# Patient Record
Sex: Male | Born: 1960 | Race: White | Hispanic: No | Marital: Married | Smoking: Never smoker
Health system: Southern US, Community
[De-identification: ages and names within clinical notes are randomized; demographics above are authoritative.]

## PROBLEM LIST (undated history)

## (undated) DIAGNOSIS — E785 Hyperlipidemia, unspecified: Secondary | ICD-10-CM

## (undated) DIAGNOSIS — Z789 Other specified health status: Secondary | ICD-10-CM

## (undated) HISTORY — PX: GUM SURGERY: SHX658

## (undated) HISTORY — PX: POLYPECTOMY: SHX149

## (undated) HISTORY — PX: COLONOSCOPY: SHX174

## (undated) HISTORY — DX: Hyperlipidemia, unspecified: E78.5

---

## 2002-09-28 ENCOUNTER — Ambulatory Visit (HOSPITAL_COMMUNITY): Admission: RE | Admit: 2002-09-28 | Discharge: 2002-09-28 | Payer: Self-pay | Admitting: General Surgery

## 2005-03-25 ENCOUNTER — Emergency Department (HOSPITAL_COMMUNITY): Admission: EM | Admit: 2005-03-25 | Discharge: 2005-03-25 | Payer: Self-pay | Admitting: Family Medicine

## 2011-07-12 ENCOUNTER — Emergency Department (HOSPITAL_COMMUNITY)
Admission: EM | Admit: 2011-07-12 | Discharge: 2011-07-12 | Disposition: A | Discharge status: Home / Self Care | Payer: BC Managed Care – PPO | Attending: Emergency Medicine | Admitting: Emergency Medicine

## 2011-07-12 DIAGNOSIS — S61209A Unspecified open wound of unspecified finger without damage to nail, initial encounter: Secondary | ICD-10-CM | POA: Insufficient documentation

## 2011-07-12 DIAGNOSIS — S61011A Laceration without foreign body of right thumb without damage to nail, initial encounter: Secondary | ICD-10-CM

## 2011-07-12 DIAGNOSIS — W260XXA Contact with knife, initial encounter: Secondary | ICD-10-CM | POA: Insufficient documentation

## 2011-07-12 DIAGNOSIS — W261XXA Contact with sword or dagger, initial encounter: Secondary | ICD-10-CM | POA: Insufficient documentation

## 2011-07-12 DIAGNOSIS — M79609 Pain in unspecified limb: Secondary | ICD-10-CM | POA: Insufficient documentation

## 2011-07-12 MED ORDER — CEPHALEXIN 500 MG PO CAPS: 500.0000 mg | ORAL_CAPSULE | Freq: Four times a day (QID) | ORAL | Status: AC

## 2011-07-12 NOTE — ED Provider Notes (Signed)
Medical screening examination/treatment/procedure(s) were performed by non-physician practitioner and as supervising physician I was immediately available for consultation/collaboration.  Nicholes Stairs, MD 07/12/11 2340

## 2011-07-12 NOTE — ED Notes (Signed)
Pt refused to wait for tetnus shot states will go and see family doctor tomorrow and get one.

## 2011-07-12 NOTE — ED Notes (Signed)
Pt to ED with c/o laceration to right thumb.  Pt states was cutting meat when he sliced his thumb.  Wound with dressnig intact; bleeding controlled.

## 2011-07-12 NOTE — ED Provider Notes (Signed)
History     CSN: [id number] Arrival date & time: 07/12/2011  9:24 PM   First MD Initiated Contact with Patient 07/12/11 2140      Chief Complaint  Patient presents with  . Extremity Laceration    laceration thumb right hand with utility knife    (Consider location/radiation/quality/duration/timing/severity/associated sxs/prior treatment) Patient is a 50 y.o. male presenting with skin laceration. The history is provided by the patient. No language interpreter was used.  Laceration  The incident occurred 1 to 2 hours ago. The laceration is located on the right hand. The laceration is 3 cm in size. The laceration mechanism was a a dirty knife. The pain is at a severity of 3/10. The pain is mild. The pain has been constant since onset. He reports no foreign bodies present. His tetanus status is UTD.    No past medical history on file.  No past surgical history on file.  No family history on file.  History  Substance Use Topics  . Smoking status: Not on file  . Smokeless tobacco: Not on file  . Alcohol Use: Not on file      Review of Systems  Constitutional: Negative.   HENT: Negative.   Eyes: Negative.   Respiratory: Negative.   Cardiovascular: Negative.   Gastrointestinal: Negative.   Genitourinary: Negative.   Musculoskeletal: Negative.   Skin: Positive for wound.  Neurological: Negative.   Hematological: Negative.   Psychiatric/Behavioral: Negative.     Allergies  Review of patient's allergies indicates no known allergies.  Home Medications   Current Outpatient Rx  Name Route Sig Dispense Refill  . ATORVASTATIN CALCIUM 10 MG PO TABS Oral Take 10 mg by mouth daily.        BP 131/83  Pulse 78  Temp(Src) 97.8 F (36.6 C) (Oral)  Resp 16  SpO2 98%  Physical Exam  Nursing note and vitals reviewed. Constitutional: He is oriented to person, place, and time. He appears well-developed and well-nourished.  HENT:  Head: Normocephalic and atraumatic.    Mouth/Throat: Oropharynx is clear and moist.  Eyes: Conjunctivae are normal. Pupils are equal, round, and reactive to light.  Neck: Normal range of motion. Neck supple.  Cardiovascular: Normal rate, regular rhythm and normal heart sounds.   Pulmonary/Chest: Effort normal.  Abdominal: Soft. There is no tenderness.  Musculoskeletal: Normal range of motion.  Neurological: He is alert and oriented to person, place, and time.  Skin: Skin is warm and dry. Laceration noted.     Psychiatric: He has a normal mood and affect. His behavior is normal. Judgment and thought content normal.    ED Course  LACERATION REPAIR Date/Time: 07/12/2011 10:34 PM Performed by: Marisue Humble, Myrian Botello C. Authorized by: Patrecia Pour Consent: Verbal consent obtained. Written consent not obtained. Risks and benefits: risks, benefits and alternatives were discussed Consent given by: patient Patient understanding: patient states understanding of the procedure being performed Patient consent: the patient's understanding of the procedure matches consent given Procedure consent: procedure consent matches procedure scheduled Relevant documents: relevant documents not present or verified Test results: test results not available Site marked: the operative site was not marked Imaging studies: imaging studies not available Patient identity confirmed: verbally with patient and arm band Time out: Immediately prior to procedure a "time out" was called to verify the correct patient, procedure, equipment, support staff and site/side marked as required. Body area: upper extremity Location details: right thumb Laceration length: 3 cm Foreign bodies: no foreign bodies Tendon involvement: none Nerve  involvement: none Vascular damage: no Anesthesia: digital block Local anesthetic: lidocaine 1% without epinephrine Anesthetic total: 7 ml Patient sedated: no Preparation: Patient was prepped and draped in the usual sterile  fashion. Irrigation solution: saline Irrigation method: syringe Amount of cleaning: extensive Debridement: minimal Degree of undermining: none Skin closure: 5-0 nylon Number of sutures: 6 Technique: simple Approximation: close Approximation difficulty: simple Dressing: antibiotic ointment, 4x4 sterile gauze and tube gauze Patient tolerance: Patient tolerated the procedure well with no immediate complications.   (including critical care time)  Labs Reviewed - No data to display No results found.   Right thumb laceration   MDM  Right thumb laceration - avulsed - repaired without difficulty - reports dirty knife, will place on abx        Zakariah Dejarnette C. [place], [place] 07/12/11 2237

## 2012-05-08 ENCOUNTER — Ambulatory Visit (INDEPENDENT_AMBULATORY_CARE_PROVIDER_SITE_OTHER): Payer: BC Managed Care – PPO | Admitting: Surgery

## 2012-05-08 ENCOUNTER — Encounter (INDEPENDENT_AMBULATORY_CARE_PROVIDER_SITE_OTHER): Payer: Self-pay | Admitting: Surgery

## 2012-05-08 VITALS — BP 114/68 | HR 64 | Temp 97.8°F | Resp 16 | Ht 74.0 in | Wt 193.0 lb

## 2012-05-08 DIAGNOSIS — E7800 Pure hypercholesterolemia, unspecified: Secondary | ICD-10-CM

## 2012-05-08 DIAGNOSIS — K4090 Unilateral inguinal hernia, without obstruction or gangrene, not specified as recurrent: Secondary | ICD-10-CM

## 2012-05-08 NOTE — Progress Notes (Signed)
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  He has noticed discomfort and a bulge in his left groin with his work and also had leisure. After a brief vacation he was less symptomatic but then he had a cough and with coughing he was having some left inguinal pain.  I described left inguinal herniorrhaphy eager laparoscopic or open done and discussed its complications including numbness, prolonged nerve pain, or recurrence. He is a fairly thin man who does have an active job and I think he would be best served with an open hernia repair with mesh. I described to him as well. He likely going to schedule this as possible and we will schedule a [facility] surgery.  No past medical history on file.  No past surgical history on file.  Current Outpatient Prescriptions  Medication Sig Dispense Refill  . atorvastatin (LIPITOR) 10 MG tablet Take 10 mg by mouth daily.         Review of patient's allergies indicates no known allergies. No family history on file. Social History:   does not have a smoking history on file. He does not have any smokeless tobacco history on file. His alcohol and drug histories not on file.   REVIEW OF SYSTEMS - PERTINENT POSITIVES ONLY: Negative for DVT or chronic medical problems  Physical Exam:   There were no vitals taken for this visit. There is no height or weight on file to calculate BMI.  Gen:  WDWN WM NAD  Neurological: Alert and oriented to person, place, and time. Motor and sensory function is grossly intact  Head: Normocephalic and atraumatic.  Eyes: Conjunctivae are normal. Pupils are equal, round, and reactive to light. No scleral icterus.  Neck: Normal range of motion. Neck supple. No tracheal deviation or thyromegaly present.  Cardiovascular:  SR without murmurs or gallops.  No carotid bruits Respiratory: Effort normal.  No respiratory distress.  No chest wall tenderness. Breath sounds normal.  No wheezes, rales or rhonchi.  Abdomen:  nontender  GU: testes without masses.  No right hernia.  There is a prominent left inguinal hernia that is reducible. Musculoskeletal: Normal range of motion. Extremities are nontender. No cyanosis, edema or clubbing noted Lymphadenopathy: No cervical, preauricular, postauricular or axillary adenopathy is present Skin: Skin is warm and dry. No rash noted. No diaphoresis. No erythema. No pallor. Pscyh: Normal mood and affect. Behavior is normal. Judgment and thought content normal.   LABORATORY RESULTS: No results found for this or any previous visit (from the past 48 hour(s)).  RADIOLOGY RESULTS: No results found.  Problem List: There is no problem list on file for this patient.   Assessment & Plan: Left inguinal hernia that is symptomatic.  Plan LIH with mesh at CDS under general    Matt B. Daphine Deutscher, MD, [facility] [phone] beeper [phone]  05/08/2012 10:24 AM

## 2012-05-08 NOTE — Patient Instructions (Signed)
Inguinal Hernia, Adult  Muscles help keep everything in the body in its proper place. But if a weak spot in the muscles develops, something can poke through. That is called a hernia. When this happens in the lower part of the belly (abdomen), it is called an inguinal hernia. (It takes its name from a part of the body in this region called the inguinal canal.) A weak spot in the wall of muscles lets some fat or part of the small intestine bulge through. An inguinal hernia can develop at any age. Men get them more often than women.  CAUSES   In adults, an inguinal hernia develops over time.  · It can be triggered by:  · Suddenly straining the muscles of the lower abdomen.  · Lifting heavy objects.  · Straining to have a bowel movement. Difficult bowel movements (constipation) can lead to this.  · Constant coughing. This may be caused by smoking or lung disease.  · Being overweight.  · Being pregnant.  · Working at a job that requires long periods of standing or heavy lifting.  · Having had an inguinal hernia before.  One type can be an emergency situation. It is called a strangulated inguinal hernia. It develops if part of the small intestine slips through the weak spot and cannot get back into the abdomen. The blood supply can be cut off. If that happens, part of the intestine may die. This situation requires emergency surgery.  SYMPTOMS   Often, a small inguinal hernia has no symptoms. It is found when a healthcare provider does a physical exam. Larger hernias usually have symptoms.   · In adults, symptoms may include:  · A lump in the groin. This is easier to see when the person is standing. It might disappear when lying down.  · In men, a lump in the scrotum.  · Pain or burning in the groin. This occurs especially when lifting, straining or coughing.  · A dull ache or feeling of pressure in the groin.  · Signs of a strangulated hernia can include:  · A bulge in the groin that becomes very painful and tender to the  touch.  · A bulge that turns red or purple.  · Fever, nausea and vomiting.  · Inability to have a bowel movement or to pass gas.  DIAGNOSIS   To decide if you have an inguinal hernia, a healthcare provider will probably do a physical examination.  · This will include asking questions about any symptoms you have noticed.  · The healthcare provider might feel the groin area and ask you to cough. If an inguinal hernia is felt, the healthcare provider may try to slide it back into the abdomen.  · Usually no other tests are needed.  TREATMENT   Treatments can vary. The size of the hernia makes a difference. Options include:  · Watchful waiting. This is often suggested if the hernia is small and you have had no symptoms.  · No medical procedure will be done unless symptoms develop.  · You will need to watch closely for symptoms. If any occur, contact your healthcare provider right away.  · Surgery. This is used if the hernia is larger or you have symptoms.  · Open surgery. This is usually an outpatient procedure (you will not stay overnight in a hospital). An cut (incision) is made through the skin in the groin. The hernia is put back inside the abdomen. The weak area in the muscles is   then repaired by herniorrhaphy or hernioplasty. Herniorrhaphy: in this type of surgery, the weak muscles are sewn back together. Hernioplasty: a patch or mesh is used to close the weak area in the abdominal wall.  · Laparoscopy. In this procedure, a surgeon makes small incisions. A thin tube with a tiny video camera (called a laparoscope) is put into the abdomen. The surgeon repairs the hernia with mesh by looking with the video camera and using two long instruments.  HOME CARE INSTRUCTIONS   · After surgery to repair an inguinal hernia:  · You will need to take pain medicine prescribed by your healthcare provider. Follow all directions carefully.  · You will need to take care of the wound from the incision.  · Your activity will be  restricted for awhile. This will probably include no heavy lifting for several weeks. You also should not do anything too active for a few weeks. When you can return to work will depend on the type of job that you have.  · During "watchful waiting" periods, you should:  · Maintain a healthy weight.  · Eat a diet high in fiber (fruits, vegetables and whole grains).  · Drink plenty of fluids to avoid constipation. This means drinking enough water and other liquids to keep your urine clear or pale yellow.  · Do not lift heavy objects.  · Do not stand for long periods of time.  · Quit smoking. This should keep you from developing a frequent cough.  SEEK MEDICAL CARE IF:   · A bulge develops in your groin area.  · You feel pain, a burning sensation or pressure in the groin. This might be worse if you are lifting or straining.  · You develop a fever of more than 100.5° F (38.1° C).  SEEK IMMEDIATE MEDICAL CARE IF:   · Pain in the groin increases suddenly.  · A bulge in the groin gets bigger suddenly and does not go down.  · For men, there is sudden pain in the scrotum. Or, the size of the scrotum increases.  · A bulge in the groin area becomes red or purple and is painful to touch.  · You have nausea or vomiting that does not go away.  · You feel your heart beating much faster than normal.  · You cannot have a bowel movement or pass gas.  · You develop a fever of more than 102.0° F (38.9° C).  Document Released: 01/02/2009 Document Revised: 08/05/2011 Document Reviewed: 01/02/2009  ExitCare® Patient Information ©2012 [employer].

## 2012-05-11 ENCOUNTER — Encounter (HOSPITAL_BASED_OUTPATIENT_CLINIC_OR_DEPARTMENT_OTHER): Payer: Self-pay | Admitting: *Deleted

## 2012-05-11 NOTE — Progress Notes (Signed)
No labs needed-healthy-active person-originally from [place]

## 2012-05-16 ENCOUNTER — Encounter (HOSPITAL_BASED_OUTPATIENT_CLINIC_OR_DEPARTMENT_OTHER): Payer: Self-pay | Admitting: Anesthesiology

## 2012-05-16 ENCOUNTER — Encounter (HOSPITAL_BASED_OUTPATIENT_CLINIC_OR_DEPARTMENT_OTHER)
Admission: RE | Disposition: A | Discharge status: Home / Self Care | Payer: Self-pay | Source: Ambulatory Visit | Attending: Surgery

## 2012-05-16 ENCOUNTER — Ambulatory Visit (HOSPITAL_BASED_OUTPATIENT_CLINIC_OR_DEPARTMENT_OTHER)
Admission: RE | Admit: 2012-05-16 | Discharge: 2012-05-16 | Disposition: A | Discharge status: Home / Self Care | Payer: BC Managed Care – PPO | Source: Ambulatory Visit | Attending: Surgery | Admitting: Surgery

## 2012-05-16 ENCOUNTER — Encounter (HOSPITAL_BASED_OUTPATIENT_CLINIC_OR_DEPARTMENT_OTHER): Payer: Self-pay | Admitting: Certified Registered Nurse Anesthetist

## 2012-05-16 ENCOUNTER — Ambulatory Visit (HOSPITAL_BASED_OUTPATIENT_CLINIC_OR_DEPARTMENT_OTHER): Payer: BC Managed Care – PPO | Admitting: Anesthesiology

## 2012-05-16 ENCOUNTER — Encounter (HOSPITAL_BASED_OUTPATIENT_CLINIC_OR_DEPARTMENT_OTHER): Payer: Self-pay | Admitting: *Deleted

## 2012-05-16 DIAGNOSIS — K4090 Unilateral inguinal hernia, without obstruction or gangrene, not specified as recurrent: Secondary | ICD-10-CM

## 2012-05-16 HISTORY — PX: INGUINAL HERNIA REPAIR: SHX194

## 2012-05-16 HISTORY — DX: Other specified health status: Z78.9

## 2012-05-16 LAB — POCT HEMOGLOBIN-HEMACUE: Hemoglobin: 14.8 g/dL (ref 13.0–17.0)

## 2012-05-16 SURGERY — REPAIR, HERNIA, INGUINAL, ADULT
Anesthesia: General | Site: Abdomen | Laterality: Left | Wound class: Clean

## 2012-05-16 MED ORDER — LIDOCAINE HCL (CARDIAC) 20 MG/ML IV SOLN
INTRAVENOUS | Status: DC | PRN
  Administered 2012-05-16: 60 mg via INTRAVENOUS

## 2012-05-16 MED ORDER — OXYCODONE HCL 5 MG PO TABS: 5.0000 mg | ORAL_TABLET | ORAL | Status: DC | PRN

## 2012-05-16 MED ORDER — MIDAZOLAM HCL 5 MG/5ML IJ SOLN
INTRAMUSCULAR | Status: DC | PRN
  Administered 2012-05-16: 2 mg via INTRAVENOUS

## 2012-05-16 MED ORDER — SODIUM CHLORIDE 0.9 % IV SOLN: 250.0000 mL | INTRAVENOUS | Status: DC | PRN

## 2012-05-16 MED ORDER — LACTATED RINGERS IV SOLN
INTRAVENOUS | Status: DC
  Administered 2012-05-16 (×2): via INTRAVENOUS

## 2012-05-16 MED ORDER — CHLORHEXIDINE GLUCONATE 4 % EX LIQD: 1.0000 "application " | Freq: Once | CUTANEOUS | Status: DC

## 2012-05-16 MED ORDER — MORPHINE SULFATE 2 MG/ML IJ SOLN: 1.0000 mg | INTRAMUSCULAR | Status: DC | PRN

## 2012-05-16 MED ORDER — SODIUM CHLORIDE 0.9 % IJ SOLN: 3.0000 mL | INTRAMUSCULAR | Status: DC | PRN

## 2012-05-16 MED ORDER — CEFAZOLIN SODIUM-DEXTROSE 2-3 GM-% IV SOLR
2.0000 g | INTRAVENOUS | Status: AC
  Administered 2012-05-16: 2 g via INTRAVENOUS

## 2012-05-16 MED ORDER — ONDANSETRON HCL 4 MG/2ML IJ SOLN
INTRAMUSCULAR | Status: DC | PRN
  Administered 2012-05-16: 4 mg via INTRAVENOUS

## 2012-05-16 MED ORDER — GLYCOPYRROLATE 0.2 MG/ML IJ SOLN
INTRAMUSCULAR | Status: DC | PRN
  Administered 2012-05-16: 0.2 mg via INTRAVENOUS

## 2012-05-16 MED ORDER — DEXAMETHASONE SODIUM PHOSPHATE 4 MG/ML IJ SOLN
INTRAMUSCULAR | Status: DC | PRN
  Administered 2012-05-16: 10 mg via INTRAVENOUS

## 2012-05-16 MED ORDER — BUPIVACAINE HCL (PF) 0.25 % IJ SOLN
INTRAMUSCULAR | Status: DC | PRN
  Administered 2012-05-16: 20 mL

## 2012-05-16 MED ORDER — PROPOFOL 10 MG/ML IV BOLUS
INTRAVENOUS | Status: DC | PRN
  Administered 2012-05-16: 200 mg via INTRAVENOUS

## 2012-05-16 MED ORDER — ACETAMINOPHEN 650 MG RE SUPP: 650.0000 mg | RECTAL | Status: DC | PRN

## 2012-05-16 MED ORDER — OXYCODONE-ACETAMINOPHEN 5-325 MG PO TABS: 1.0000 | ORAL_TABLET | ORAL | Status: DC | PRN

## 2012-05-16 MED ORDER — ACETAMINOPHEN 325 MG PO TABS: 650.0000 mg | ORAL_TABLET | ORAL | Status: DC | PRN

## 2012-05-16 MED ORDER — FENTANYL CITRATE 0.05 MG/ML IJ SOLN
INTRAMUSCULAR | Status: DC | PRN
  Administered 2012-05-16: 100 ug via INTRAVENOUS
  Administered 2012-05-16: 50 ug via INTRAVENOUS

## 2012-05-16 MED ORDER — ONDANSETRON HCL 4 MG/2ML IJ SOLN: 4.0000 mg | Freq: Four times a day (QID) | INTRAMUSCULAR | Status: DC | PRN

## 2012-05-16 MED ORDER — HEPARIN SODIUM (PORCINE) 5000 UNIT/ML IJ SOLN
5000.0000 [IU] | Freq: Once | INTRAMUSCULAR | Status: AC
  Administered 2012-05-16: 5000 [IU] via SUBCUTANEOUS

## 2012-05-16 MED ORDER — SODIUM CHLORIDE 0.9 % IJ SOLN: 3.0000 mL | Freq: Two times a day (BID) | INTRAMUSCULAR | Status: DC

## 2012-05-16 SURGICAL SUPPLY — 50 items
APL SKNCLS STERI-STRIP NONHPOA (GAUZE/BANDAGES/DRESSINGS)
BENZOIN TINCTURE PRP APPL 2/3 (GAUZE/BANDAGES/DRESSINGS) IMPLANT
BLADE SURG 15 STRL LF DISP TIS (BLADE) ×1 IMPLANT
BLADE SURG 15 STRL SS (BLADE) ×1
BLADE SURG ROTATE 9660 (MISCELLANEOUS) IMPLANT
CANISTER SUCTION 1200CC (MISCELLANEOUS) ×2 IMPLANT
CLEANER CAUTERY TIP 5X5 PAD (MISCELLANEOUS) ×1 IMPLANT
CLOTH BEACON ORANGE TIMEOUT ST (SAFETY) ×2 IMPLANT
COVER MAYO STAND STRL (DRAPES) ×2 IMPLANT
COVER TABLE BACK 60X90 (DRAPES) ×2 IMPLANT
DECANTER SPIKE VIAL GLASS SM (MISCELLANEOUS) ×2 IMPLANT
DERMABOND ADVANCED (GAUZE/BANDAGES/DRESSINGS) ×1
DERMABOND ADVANCED .7 DNX12 (GAUZE/BANDAGES/DRESSINGS) ×1 IMPLANT
DRAIN PENROSE 1/2X12 LTX STRL (WOUND CARE) ×2 IMPLANT
DRAPE LAPAROTOMY T 102X78X121 (DRAPES) ×2 IMPLANT
ELECT REM PT RETURN 9FT ADLT (ELECTROSURGICAL) ×2
ELECTRODE REM PT RTRN 9FT ADLT (ELECTROSURGICAL) ×1 IMPLANT
GAUZE SPONGE 4X4 12PLY STRL LF (GAUZE/BANDAGES/DRESSINGS) ×4 IMPLANT
GAUZE SPONGE 4X4 16PLY XRAY LF (GAUZE/BANDAGES/DRESSINGS) IMPLANT
GLOVE BIO SURGEON STRL SZ 6.5 (GLOVE) ×2 IMPLANT
GLOVE BIO SURGEON STRL SZ8 (GLOVE) ×2 IMPLANT
GLOVE EXAM NITRILE EXT CFF LRG (GLOVE) ×2 IMPLANT
GOWN PREVENTION PLUS XLARGE (GOWN DISPOSABLE) ×2 IMPLANT
GOWN PREVENTION PLUS XXLARGE (GOWN DISPOSABLE) ×2 IMPLANT
MESH HERNIA 3X6 (Mesh General) ×2 IMPLANT
NEEDLE HYPO 25X1 1.5 SAFETY (NEEDLE) IMPLANT
NS IRRIG 1000ML POUR BTL (IV SOLUTION) ×2 IMPLANT
PACK BASIN DAY SURGERY FS (CUSTOM PROCEDURE TRAY) ×2 IMPLANT
PAD CLEANER CAUTERY TIP 5X5 (MISCELLANEOUS) ×1
PENCIL BUTTON HOLSTER BLD 10FT (ELECTRODE) ×2 IMPLANT
SLEEVE SCD COMPRESS KNEE MED (MISCELLANEOUS) IMPLANT
STAPLER VISISTAT 35W (STAPLE) IMPLANT
STRIP CLOSURE SKIN 1/2X4 (GAUZE/BANDAGES/DRESSINGS) IMPLANT
SUT MON AB 5-0 PS2 18 (SUTURE) IMPLANT
SUT PROLENE 0 CT 1 30 (SUTURE) IMPLANT
SUT PROLENE 2 0 CT2 30 (SUTURE) ×4 IMPLANT
SUT SILK 2 0 SH (SUTURE) ×2 IMPLANT
SUT VIC AB 2-0 SH 27 (SUTURE) ×1
SUT VIC AB 2-0 SH 27XBRD (SUTURE) ×1 IMPLANT
SUT VIC AB 4-0 SH 18 (SUTURE) ×2 IMPLANT
SUT VIC AB 5-0 P-3 18X BRD (SUTURE) IMPLANT
SUT VIC AB 5-0 P3 18 (SUTURE)
SUT VICRYL 4-0 PS2 18IN ABS (SUTURE) IMPLANT
SYR BULB 3OZ (MISCELLANEOUS) ×2 IMPLANT
SYR CONTROL 10ML LL (SYRINGE) IMPLANT
TOWEL OR 17X24 6PK STRL BLUE (TOWEL DISPOSABLE) ×4 IMPLANT
TRAY DSU PREP LF (CUSTOM PROCEDURE TRAY) ×2 IMPLANT
TUBE CONNECTING 20X1/4 (TUBING) ×2 IMPLANT
WATER STERILE IRR 1000ML POUR (IV SOLUTION) ×2 IMPLANT
YANKAUER SUCT BULB TIP NO VENT (SUCTIONS) ×2 IMPLANT

## 2012-05-16 NOTE — Anesthesia Preprocedure Evaluation (Addendum)
Anesthesia Evaluation Anesthesia Physical Anesthesia Plan Anesthesia Quick Evaluation  

## 2012-05-16 NOTE — H&P (Addendum)
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 He has noticed discomfort and a bulge in his left groin with his work and also had leisure. After a brief vacation he was less symptomatic but then he had a cough and with coughing he was having some left inguinal pain.  I described left inguinal herniorrhaphy eager laparoscopic or open done and discussed its complications including numbness, prolonged nerve pain, or recurrence. He is a fairly thin man who does have an active job and I think he would be best served with an open hernia repair with mesh. I described to him as well. He likely going to schedule this as possible and we will schedule a [facility] surgery.  No past medical history on file.  No past surgical history on file.  Current Outpatient Prescriptions   Medication  Sig  Dispense  Refill   .  atorvastatin (LIPITOR) 10 MG tablet  Take 10 mg by mouth daily.      Review of patient's allergies indicates no known allergies.  No family history on file.  Social History: does not have a smoking history on file. He does not have any smokeless tobacco history on file. His alcohol and drug histories not on file.  REVIEW OF SYSTEMS - PERTINENT POSITIVES ONLY:  Negative for DVT or chronic medical problems  Physical Exam:  There were no vitals taken for this visit.  There is no height or weight on file to calculate BMI.  Gen: WDWN WM NAD  Neurological: Alert and oriented to person, place, and time. Motor and sensory function is grossly intact  Head: Normocephalic and atraumatic.  Eyes: Conjunctivae are normal. Pupils are equal, round, and reactive to light. No scleral icterus.  Neck: Normal range of motion. Neck supple. No tracheal deviation or thyromegaly present.  Cardiovascular: SR without murmurs or gallops. No carotid bruits  Respiratory: Effort normal. No respiratory distress. No chest  wall tenderness. Breath sounds normal. No wheezes, rales or rhonchi.  Abdomen: nontender  GU: testes without masses. No right hernia. There is a prominent left inguinal hernia that is reducible.  Musculoskeletal: Normal range of motion. Extremities are nontender. No cyanosis, edema or clubbing noted Lymphadenopathy: No cervical, preauricular, postauricular or axillary adenopathy is present Skin: Skin is warm and dry. No rash noted. No diaphoresis. No erythema. No pallor. Pscyh: Normal mood and affect. Behavior is normal. Judgment and thought content normal.  LABORATORY RESULTS:  No results found for this or any previous visit (from the past 48 hour(s)).  RADIOLOGY RESULTS:  No results found.  Problem List:  There is no problem list on file for this patient.   Assessment & Plan:  Left inguinal hernia that is symptomatic. Plan LIH with mesh at CDS under general  Matt B. Daphine Deutscher, MD, [facility]  [phone] beeper  [phone]  Seen in holding area and all questions answered.    There has been no change in the patient's past medical history or physical exam in the past 24 hours to the best of my knowledge. I examined the patient in the holding area and have made any changes to the history and physical exam report that is included above.   Expectations and outcome results have been discussed with the patient to include risks and benefits.  All questions have been answered and we will proceed with previously discussed procedure noted  and signed in the consent form in the patient's record.    Jahyra Sukup BMD FACS 1:55 PM  05/16/2012

## 2012-05-16 NOTE — Anesthesia Procedure Notes (Signed)
Procedure Name: LMA Insertion Date/Time: 05/16/2012 1:54 PM Performed by: Burna Cash Pre-anesthesia Checklist: Patient identified, Emergency Drugs available, Suction available and Patient being monitored Patient Re-evaluated:Patient Re-evaluated prior to inductionOxygen Delivery Method: Circle System Utilized Preoxygenation: Pre-oxygenation with 100% oxygen Intubation Type: IV induction Ventilation: Mask ventilation without difficulty LMA: LMA inserted LMA Size: 5.0 Number of attempts: 1 Airway Equipment and Method: bite block Placement Confirmation: positive ETCO2 Tube secured with: Tape Dental Injury: Teeth and Oropharynx as per pre-operative assessment

## 2012-05-16 NOTE — Anesthesia Postprocedure Evaluation (Signed)
  Anesthesia Post-op Note  Patient: Dan Mckenzie  Procedure(s) Performed: Procedure(s) (LRB) with comments: HERNIA REPAIR INGUINAL ADULT (Left) - open left inguinal hernia repair  Patient Location: PACU  Anesthesia Type: General  Level of Consciousness: awake, alert  and oriented  Airway and Oxygen Therapy: Patient Spontanous Breathing  Post-op Pain: mild  Post-op Assessment: Post-op Vital signs reviewed, Patient's Cardiovascular Status Stable, Respiratory Function Stable, Patent Airway and No signs of Nausea or vomiting  Post-op Vital Signs: Reviewed and stable  Complications: No apparent anesthesia complications

## 2012-05-16 NOTE — Transfer of Care (Signed)
Immediate Anesthesia Transfer of Care Note  Patient: Dan Mckenzie  Procedure(s) Performed: Procedure(s) (LRB) with comments: HERNIA REPAIR INGUINAL ADULT (Left) - open left inguinal hernia repair  Patient Location: PACU  Anesthesia Type: General  Level of Consciousness: awake, sedated and patient cooperative  Airway & Oxygen Therapy: Patient Spontanous Breathing and Patient connected to face mask oxygen  Post-op Assessment: Report given to PACU RN and Post -op Vital signs reviewed and stable  Post vital signs: Reviewed and stable  Complications: No apparent anesthesia complications

## 2012-05-16 NOTE — Op Note (Signed)
Surgeon: Wenda Low, MD, FACS  Asst:  none  Anes:  general  Procedure: Left inguinal hernia repair with mesh  Diagnosis: Left indirect inguinal hernia  Complications: none  EBL:   Minimal   cc  Description of Procedure:  The patient was taken to room 8 at day surgery. General anesthesia was administered. A left-sided been marked and was clipped and prepped with PCMX and draped sterilely. A small oblique incision was made in the left groin and carried down dividing the superficial veins with 4-0 Vicryl. The external oblique fibers were noted to be very attenuated from the external ring up to the the anterior superior iliac spine. This was a separation which I used to incise in an open the external Blake down to the external ring and then mobilize the cord and place a Penrose about it. Prominently proximally and anteromedially there was a hernia that had dissected free from the cord structures. I opened it and with my finger inside could feel the floor which felt okay but to ring which slightly dilated at this prominent indirect hernia. This was closed with a suture ligature of 2-0 silk under direct vision and then tied around the base of the sac.  The ring was approximated with a figure-of-eight suture 2-0 Prolene medially and then the floor was reinforced with piece of Marlex mesh which was cut to fit and sutured along the inguinal ligament with running 2-0 Prolene medially with 2-0 Prolene placed around the cord structures and sutured to itself. The external oblique was then closed also with a running 2-0 Vicryl. All in area was injected with Marcaine at the beginning of the case as well as at the end of the case. The wound was then closed 4-0 Vicryl and with 4-0 Vicryl subcutaneously and subcuticularly enabling [place] to close the skin with Dermabond. Patient are that she is well taken recovery in satisfactory condition.  Matt B. Daphine Deutscher, MD, [facility], [place] [phone]

## 2012-05-17 ENCOUNTER — Encounter (HOSPITAL_BASED_OUTPATIENT_CLINIC_OR_DEPARTMENT_OTHER): Payer: Self-pay | Admitting: Surgery

## 2012-05-18 ENCOUNTER — Telehealth (INDEPENDENT_AMBULATORY_CARE_PROVIDER_SITE_OTHER): Payer: Self-pay | Admitting: General Surgery

## 2012-05-18 NOTE — Telephone Encounter (Signed)
Message copied by Littie Deeds on Thu May 18, 2012  3:16 PM ------      Message from: Cathi Roan      Created: Thu May 18, 2012  1:58 PM      Regarding: 1st post op      Contact: [phone]       Patients post op needs to be moved up, nurse w/[facility] told him he needed sooner appt.

## 2012-05-18 NOTE — Telephone Encounter (Signed)
Spoke with pt and informed him that he would need a sooner PO appt to see Dr. Daphine Deutscher.  I rescheduled it for 10/2 at 1:40.  He was fine with this. I also sent a reminder card in the mail.

## 2012-05-31 ENCOUNTER — Encounter (INDEPENDENT_AMBULATORY_CARE_PROVIDER_SITE_OTHER): Payer: Self-pay | Admitting: Surgery

## 2012-05-31 ENCOUNTER — Ambulatory Visit (INDEPENDENT_AMBULATORY_CARE_PROVIDER_SITE_OTHER): Payer: BC Managed Care – PPO | Admitting: Surgery

## 2012-05-31 VITALS — BP 126/80 | HR 74 | Temp 98.0°F | Resp 18 | Ht 74.0 in | Wt 197.0 lb

## 2012-05-31 DIAGNOSIS — Z8719 Personal history of other diseases of the digestive system: Secondary | ICD-10-CM | POA: Insufficient documentation

## 2012-05-31 DIAGNOSIS — Z9889 Other specified postprocedural states: Secondary | ICD-10-CM

## 2012-05-31 NOTE — Progress Notes (Signed)
Dan Mckenzie 51 y.o.  Body mass index is 25.29 kg/(m^2).  Patient Active Problem List  Diagnosis  . Hypercholesteremia    No Known Allergies  Past Surgical History  Procedure Date  . Gum surgery   . Inguinal hernia repair 05/16/2012    Procedure: HERNIA REPAIR INGUINAL ADULT;  Surgeon: Valarie Merino, MD;  Location: [facility];  Service: General;  Laterality: Left;  open left inguinal hernia repair   Kari Baars, MD No diagnosis found.  Open LIH Incision OK as is repair.  Having usual degree of postop pain.  Had some weird sensation during ejaculation.  Some medial inferior incison numbness. Return for recheck in 2 months Matt B. Daphine Deutscher, MD, [facility] [phone] beeper [phone]  05/31/2012 2:43 PM

## 2012-05-31 NOTE — Patient Instructions (Signed)
Resume activities as tolerated

## 2012-06-22 ENCOUNTER — Encounter (INDEPENDENT_AMBULATORY_CARE_PROVIDER_SITE_OTHER): Payer: BC Managed Care – PPO | Admitting: Surgery

## 2012-07-25 ENCOUNTER — Encounter: Payer: Self-pay | Admitting: Internal Medicine

## 2012-09-05 ENCOUNTER — Encounter (INDEPENDENT_AMBULATORY_CARE_PROVIDER_SITE_OTHER): Payer: BC Managed Care – PPO | Admitting: Surgery

## 2012-09-05 ENCOUNTER — Telehealth (INDEPENDENT_AMBULATORY_CARE_PROVIDER_SITE_OTHER): Payer: Self-pay

## 2012-09-05 NOTE — Telephone Encounter (Signed)
Called pt to see if he would be willing to reschedule his appt from 01/07 to 01/09.  The pt had forgotten about his appt for today so he had no problem changing his date and time to [facility] 01/09 @10am 

## 2012-09-07 ENCOUNTER — Encounter (INDEPENDENT_AMBULATORY_CARE_PROVIDER_SITE_OTHER): Payer: BC Managed Care – PPO | Admitting: Surgery

## 2012-09-11 ENCOUNTER — Encounter (INDEPENDENT_AMBULATORY_CARE_PROVIDER_SITE_OTHER): Payer: Self-pay | Admitting: Surgery

## 2012-09-20 ENCOUNTER — Encounter: Payer: BC Managed Care – PPO | Admitting: Internal Medicine

## 2013-07-31 ENCOUNTER — Encounter: Payer: Self-pay | Admitting: Internal Medicine

## 2013-10-10 ENCOUNTER — Ambulatory Visit (AMBULATORY_SURGERY_CENTER): Payer: Self-pay | Admitting: *Deleted

## 2013-10-10 VITALS — Ht 74.0 in | Wt 193.0 lb

## 2013-10-10 DIAGNOSIS — Z1211 Encounter for screening for malignant neoplasm of colon: Secondary | ICD-10-CM

## 2013-10-10 MED ORDER — MOVIPREP 100 G PO SOLR: ORAL | Status: DC

## 2013-10-10 NOTE — Progress Notes (Signed)
Patient denies any allergies to eggs or soy. Patient denies any problems with anesthesia.  

## 2013-10-11 ENCOUNTER — Encounter: Payer: Self-pay | Admitting: Internal Medicine

## 2013-10-24 ENCOUNTER — Encounter: Payer: Self-pay | Admitting: Internal Medicine

## 2013-10-24 ENCOUNTER — Ambulatory Visit (AMBULATORY_SURGERY_CENTER): Payer: BC Managed Care – PPO | Admitting: Internal Medicine

## 2013-10-24 VITALS — BP 124/82 | HR 65 | Temp 97.6°F | Resp 14 | Ht 74.0 in | Wt 193.0 lb

## 2013-10-24 DIAGNOSIS — D126 Benign neoplasm of colon, unspecified: Secondary | ICD-10-CM

## 2013-10-24 DIAGNOSIS — Z1211 Encounter for screening for malignant neoplasm of colon: Secondary | ICD-10-CM

## 2013-10-24 HISTORY — PX: COLONOSCOPY: SHX174

## 2013-10-24 MED ORDER — SODIUM CHLORIDE 0.9 % IV SOLN: 500.0000 mL | INTRAVENOUS | Status: DC

## 2013-10-24 NOTE — Op Note (Signed)
[place]  [employer]. [place], [address]   COLONOSCOPY PROCEDURE REPORT  PATIENT: Dan, Mckenzie  MR#: [id number] BIRTHDATE: [birth year] , 52  yrs. old GENDER: Male ENDOSCOPIST: Eustace Quail, MD REFERRED BY:W.  Lutricia Feil, M.D. PROCEDURE DATE:  10/24/2013 PROCEDURE:   Colonoscopy with snare polypectomy x 1 First Screening Colonoscopy - Avg.  risk and is 50 yrs.  old or older Yes.  Prior Negative Screening - Now for repeat screening. N/A  History of Adenoma - Now for follow-up colonoscopy & has been > or = to 3 yrs.  N/A  Polyps Removed Today? Yes. ASA CLASS:   Class II INDICATIONS:average risk screening. MEDICATIONS: MAC sedation, administered by CRNA and propofol (Diprivan) 350mg  IV  DESCRIPTION OF PROCEDURE:   After the risks benefits and alternatives of the procedure were thoroughly explained, informed consent was obtained.  A digital rectal exam revealed no abnormalities of the rectum.   The LB [id number]  endoscope was introduced through the anus and advanced to the cecum, which was identified by both the appendix and ileocecal valve. No adverse events experienced.   The quality of the prep was excellent, using MoviPrep  The instrument was then slowly withdrawn as the colon was fully examined.      COLON FINDINGS: A diminutive polyp was found in the transverse colon.  A polypectomy was performed with a cold snare.  The resection was complete and the polyp tissue was completely retrieved.   The colon was otherwise normal.  There was no diverticulosis, inflammation, other polyps or cancers . Retroflexed views revealed internal hemorrhoids. The time to cecum=2 minutes 52 seconds.  Withdrawal time=14 minutes 55 seconds. The scope was withdrawn and the procedure completed.  COMPLICATIONS: There were no complications.  ENDOSCOPIC IMPRESSION: 1.   Diminutive polyp was found in the transverse colon; polypectomy was performed with a cold  snare 2.   The colon was otherwise normal  RECOMMENDATIONS: 1. Repeat colonoscopy in 5 years if polyp adenomatous; otherwise 10 years   eSigned:  Eustace Quail, MD 10/24/2013 9:37 AM   cc: Janalyn Rouse, MD and The Patient

## 2013-10-24 NOTE — Patient Instructions (Signed)
YOU HAD AN ENDOSCOPIC PROCEDURE TODAY AT THE [facility]: Refer to the procedure report that was given to you for any specific questions about what was found during the examination.  If the procedure report does not answer your questions, please call your gastroenterologist to clarify.  If you requested that your care partner not be given the details of your procedure findings, then the procedure report has been included in a sealed envelope for you to review at your convenience later.  YOU SHOULD EXPECT: Some feelings of bloating in the abdomen. Passage of more gas than usual.  Walking can help get rid of the air that was put into your GI tract during the procedure and reduce the bloating. If you had a lower endoscopy (such as a colonoscopy or flexible sigmoidoscopy) you may notice spotting of blood in your stool or on the toilet paper. If you underwent a bowel prep for your procedure, then you may not have a normal bowel movement for a few days.  DIET: Your first meal following the procedure should be a light meal and then it is ok to progress to your normal diet.  A half-sandwich or bowl of soup is an example of a good first meal.  Heavy or fried foods are harder to digest and may make you feel nauseous or bloated.  Likewise meals heavy in dairy and vegetables can cause extra gas to form and this can also increase the bloating.  Drink plenty of fluids but you should avoid alcoholic beverages for 24 hours.  ACTIVITY: Your care partner should take you home directly after the procedure.  You should plan to take it easy, moving slowly for the rest of the day.  You can resume normal activity the day after the procedure however you should NOT DRIVE or use heavy machinery for 24 hours (because of the sedation medicines used during the test).    SYMPTOMS TO REPORT IMMEDIATELY: A gastroenterologist can be reached at any hour.  During normal business hours, 8:30 AM to 5:00 PM Monday through Friday,  call [phone].  After hours and on weekends, please call the GI answering service at [phone] who will take a message and have the physician on call contact you.   Following lower endoscopy (colonoscopy or flexible sigmoidoscopy):  Excessive amounts of blood in the stool  Significant tenderness or worsening of abdominal pains  Swelling of the abdomen that is new, acute  Fever of 100F or higher    FOLLOW UP: If any biopsies were taken you will be contacted by phone or by letter within the next 1-3 weeks.  Call your gastroenterologist if you have not heard about the biopsies in 3 weeks.  Our staff will call the home number listed on your records the next business day following your procedure to check on you and address any questions or concerns that you may have at that time regarding the information given to you following your procedure. This is a courtesy call and so if there is no answer at the home number and we have not heard from you through the emergency physician on call, we will assume that you have returned to your regular daily activities without incident.  SIGNATURES/CONFIDENTIALITY: You and/or your care partner have signed paperwork which will be entered into your electronic medical record.  These signatures attest to the fact that that the information above on your After Visit Summary has been reviewed and is understood.  Full responsibility of the confidentiality   this discharge information lies with you and/or your care-partner.   INFORMATION ON POLYPS GIVEN TO YOU TODAY 

## 2013-10-24 NOTE — Progress Notes (Signed)
Called to room to assist during endoscopic procedure.  Patient ID and intended procedure confirmed with present staff. Received instructions for my participation in the procedure from the performing physician.  

## 2013-10-26 ENCOUNTER — Telehealth: Payer: Self-pay | Admitting: *Deleted

## 2013-10-26 NOTE — Telephone Encounter (Signed)
  Follow up Call-  Call back number 10/24/2013  Post procedure Call Back phone  # [phone]  Permission to leave phone message Yes     Patient questions:  Do you have a fever, pain , or abdominal swelling? no Pain Score  0 *  Have you tolerated food without any problems? yes  Have you been able to return to your normal activities? yes  Do you have any questions about your discharge instructions: Diet   no Medications  no Follow up visit  no  Do you have questions or concerns about your Care? no  Actions: * If pain score is 4 or above: No action needed, pain <4.

## 2013-10-30 ENCOUNTER — Encounter: Payer: Self-pay | Admitting: Internal Medicine

## 2016-09-20 DIAGNOSIS — Z0000 Encounter for general adult medical examination without abnormal findings: Secondary | ICD-10-CM | POA: Diagnosis not present

## 2016-09-20 DIAGNOSIS — Z125 Encounter for screening for malignant neoplasm of prostate: Secondary | ICD-10-CM | POA: Diagnosis not present

## 2016-09-20 DIAGNOSIS — R8299 Other abnormal findings in urine: Secondary | ICD-10-CM | POA: Diagnosis not present

## 2016-09-27 DIAGNOSIS — E784 Other hyperlipidemia: Secondary | ICD-10-CM | POA: Diagnosis not present

## 2016-09-27 DIAGNOSIS — Z0000 Encounter for general adult medical examination without abnormal findings: Secondary | ICD-10-CM | POA: Diagnosis not present

## 2016-09-27 DIAGNOSIS — M25561 Pain in right knee: Secondary | ICD-10-CM | POA: Diagnosis not present

## 2016-09-27 DIAGNOSIS — Z86010 Personal history of colonic polyps: Secondary | ICD-10-CM | POA: Diagnosis not present

## 2016-09-27 DIAGNOSIS — Z1389 Encounter for screening for other disorder: Secondary | ICD-10-CM | POA: Diagnosis not present

## 2016-09-27 DIAGNOSIS — M67432 Ganglion, left wrist: Secondary | ICD-10-CM | POA: Diagnosis not present

## 2016-09-27 DIAGNOSIS — Z125 Encounter for screening for malignant neoplasm of prostate: Secondary | ICD-10-CM | POA: Diagnosis not present

## 2016-10-11 DIAGNOSIS — Z1212 Encounter for screening for malignant neoplasm of rectum: Secondary | ICD-10-CM | POA: Diagnosis not present

## 2017-09-14 DIAGNOSIS — Z125 Encounter for screening for malignant neoplasm of prostate: Secondary | ICD-10-CM | POA: Diagnosis not present

## 2017-09-14 DIAGNOSIS — E7849 Other hyperlipidemia: Secondary | ICD-10-CM | POA: Diagnosis not present

## 2017-09-22 DIAGNOSIS — Z0000 Encounter for general adult medical examination without abnormal findings: Secondary | ICD-10-CM | POA: Diagnosis not present

## 2017-09-22 DIAGNOSIS — Z572 Occupational exposure to dust: Secondary | ICD-10-CM | POA: Diagnosis not present

## 2017-09-22 DIAGNOSIS — E7849 Other hyperlipidemia: Secondary | ICD-10-CM | POA: Diagnosis not present

## 2017-09-22 DIAGNOSIS — Z1389 Encounter for screening for other disorder: Secondary | ICD-10-CM | POA: Diagnosis not present

## 2017-09-22 DIAGNOSIS — Z86010 Personal history of colonic polyps: Secondary | ICD-10-CM | POA: Diagnosis not present

## 2017-09-22 DIAGNOSIS — R1032 Left lower quadrant pain: Secondary | ICD-10-CM | POA: Diagnosis not present

## 2018-01-31 DIAGNOSIS — W57XXXA Bitten or stung by nonvenomous insect and other nonvenomous arthropods, initial encounter: Secondary | ICD-10-CM | POA: Diagnosis not present

## 2018-01-31 DIAGNOSIS — L298 Other pruritus: Secondary | ICD-10-CM | POA: Diagnosis not present

## 2018-05-15 DIAGNOSIS — L089 Local infection of the skin and subcutaneous tissue, unspecified: Secondary | ICD-10-CM | POA: Diagnosis not present

## 2018-05-15 DIAGNOSIS — T1490XA Injury, unspecified, initial encounter: Secondary | ICD-10-CM | POA: Diagnosis not present

## 2018-05-15 DIAGNOSIS — Z6826 Body mass index (BMI) 26.0-26.9, adult: Secondary | ICD-10-CM | POA: Diagnosis not present

## 2018-10-24 DIAGNOSIS — R82998 Other abnormal findings in urine: Secondary | ICD-10-CM | POA: Diagnosis not present

## 2018-10-25 DIAGNOSIS — Z125 Encounter for screening for malignant neoplasm of prostate: Secondary | ICD-10-CM | POA: Diagnosis not present

## 2018-10-25 DIAGNOSIS — Z0000 Encounter for general adult medical examination without abnormal findings: Secondary | ICD-10-CM | POA: Diagnosis not present

## 2018-10-25 DIAGNOSIS — E7849 Other hyperlipidemia: Secondary | ICD-10-CM | POA: Diagnosis not present

## 2018-10-28 ENCOUNTER — Encounter: Payer: Self-pay | Admitting: Internal Medicine

## 2018-10-31 DIAGNOSIS — M25519 Pain in unspecified shoulder: Secondary | ICD-10-CM | POA: Diagnosis not present

## 2018-10-31 DIAGNOSIS — Z86010 Personal history of colonic polyps: Secondary | ICD-10-CM | POA: Diagnosis not present

## 2018-10-31 DIAGNOSIS — Z1331 Encounter for screening for depression: Secondary | ICD-10-CM | POA: Diagnosis not present

## 2018-10-31 DIAGNOSIS — F900 Attention-deficit hyperactivity disorder, predominantly inattentive type: Secondary | ICD-10-CM | POA: Diagnosis not present

## 2018-10-31 DIAGNOSIS — H9190 Unspecified hearing loss, unspecified ear: Secondary | ICD-10-CM | POA: Diagnosis not present

## 2018-10-31 DIAGNOSIS — Z0000 Encounter for general adult medical examination without abnormal findings: Secondary | ICD-10-CM | POA: Diagnosis not present

## 2018-11-01 DIAGNOSIS — Z1212 Encounter for screening for malignant neoplasm of rectum: Secondary | ICD-10-CM | POA: Diagnosis not present

## 2018-11-02 ENCOUNTER — Encounter: Payer: Self-pay | Admitting: Internal Medicine

## 2018-11-07 ENCOUNTER — Encounter: Payer: Self-pay | Admitting: Internal Medicine

## 2018-11-23 ENCOUNTER — Telehealth: Payer: Self-pay | Admitting: *Deleted

## 2018-11-23 NOTE — Telephone Encounter (Signed)
Attempted to reach patient to confirm insurance card and completing PV by phone as scheduled on Monday 11/27/18 at 74 am. Home phone number busy and mobile phone unable to leave message.

## 2018-11-23 NOTE — Telephone Encounter (Signed)
Able to reach pt--we will call Monday 11/27/18 at 830 am to complete previsit by phone. Verified insurance card up to date in computer. He will bring newer card that has been issued to colonoscopy on 12/25/18

## 2018-11-27 ENCOUNTER — Ambulatory Visit (AMBULATORY_SURGERY_CENTER): Payer: Self-pay | Admitting: *Deleted

## 2018-11-27 ENCOUNTER — Other Ambulatory Visit: Payer: Self-pay

## 2018-11-27 VITALS — Ht 73.0 in | Wt 192.0 lb

## 2018-11-27 DIAGNOSIS — Z86010 Personal history of colonic polyps: Secondary | ICD-10-CM

## 2018-11-27 MED ORDER — SUPREP BOWEL PREP KIT 17.5-3.13-1.6 GM/177ML PO SOLN: 1.0000 | Freq: Once | ORAL | 0 refills | Status: AC

## 2018-11-27 NOTE — Progress Notes (Signed)
No egg or soy allergy known to patient  No issues with past sedation with any surgeries  or procedures, no intubation problems  No diet pills per patient No home 02 use per patient  No blood thinners per patient  Pt denies issues with constipation  No A fib or A flutter  EMMI video sent to pt's e mail  On the prep instructions procedure time written in for 8 am Verified insurance information. Changed procedure date with patient to be in compliance with present Stay at at Home orders by [facility]. [place]. Informed patient of mailing of prep instructions and Suprep coupon. Patient aware to mail back Preprocedure patient acknowledgement form.Contact # given for any questions or concerns.

## 2018-12-05 ENCOUNTER — Ambulatory Visit: Payer: Self-pay | Admitting: Internal Medicine

## 2018-12-25 ENCOUNTER — Encounter: Payer: Self-pay | Admitting: Internal Medicine

## 2019-01-03 ENCOUNTER — Encounter: Payer: Self-pay | Admitting: Internal Medicine

## 2019-01-17 ENCOUNTER — Encounter: Payer: Self-pay | Admitting: Internal Medicine

## 2019-04-16 DIAGNOSIS — M542 Cervicalgia: Secondary | ICD-10-CM | POA: Diagnosis not present

## 2019-04-16 DIAGNOSIS — M545 Low back pain: Secondary | ICD-10-CM | POA: Diagnosis not present

## 2019-07-27 DIAGNOSIS — Z20828 Contact with and (suspected) exposure to other viral communicable diseases: Secondary | ICD-10-CM | POA: Diagnosis not present

## 2019-11-06 DIAGNOSIS — E7849 Other hyperlipidemia: Secondary | ICD-10-CM | POA: Diagnosis not present

## 2019-11-06 DIAGNOSIS — Z0000 Encounter for general adult medical examination without abnormal findings: Secondary | ICD-10-CM | POA: Diagnosis not present

## 2019-11-06 DIAGNOSIS — Z125 Encounter for screening for malignant neoplasm of prostate: Secondary | ICD-10-CM | POA: Diagnosis not present

## 2019-11-12 ENCOUNTER — Other Ambulatory Visit: Payer: Self-pay | Admitting: Internal Medicine

## 2019-11-12 DIAGNOSIS — Z0000 Encounter for general adult medical examination without abnormal findings: Secondary | ICD-10-CM | POA: Diagnosis not present

## 2019-11-12 DIAGNOSIS — R7301 Impaired fasting glucose: Secondary | ICD-10-CM | POA: Diagnosis not present

## 2019-11-12 DIAGNOSIS — M720 Palmar fascial fibromatosis [Dupuytren]: Secondary | ICD-10-CM | POA: Diagnosis not present

## 2019-11-12 DIAGNOSIS — Z1331 Encounter for screening for depression: Secondary | ICD-10-CM | POA: Diagnosis not present

## 2019-11-12 DIAGNOSIS — E785 Hyperlipidemia, unspecified: Secondary | ICD-10-CM

## 2019-11-12 DIAGNOSIS — M545 Low back pain: Secondary | ICD-10-CM | POA: Diagnosis not present

## 2019-11-23 DIAGNOSIS — Z1212 Encounter for screening for malignant neoplasm of rectum: Secondary | ICD-10-CM | POA: Diagnosis not present

## 2019-12-26 ENCOUNTER — Other Ambulatory Visit: Payer: BLUE CROSS/BLUE SHIELD

## 2020-01-04 ENCOUNTER — Ambulatory Visit
Admission: RE | Admit: 2020-01-04 | Discharge: 2020-01-04 | Disposition: A | Discharge status: Home / Self Care | Payer: Self-pay | Source: Ambulatory Visit | Attending: Internal Medicine | Admitting: Internal Medicine

## 2020-01-04 DIAGNOSIS — E785 Hyperlipidemia, unspecified: Secondary | ICD-10-CM

## 2020-08-18 IMAGING — CT CT CARDIAC CORONARY ARTERY CALCIUM SCORE
3 series · 14 of 20 positions shown, 16 images · non-contrast
Comparison: None.

CLINICAL DATA: Hyperlipidemia.

EXAM:
CT CARDIAC CORONARY ARTERY CALCIUM SCORE
TECHNIQUE: Non-contrast imaging through the heart was performed using
prospective ECG gating. Image post processing was performed on an
independent workstation, allowing for quantitative analysis of the
heart and coronary arteries. Note that this exam targets the heart
and the chest was not imaged in its entirety.

[Series 2: calcium scoring 2.00 qr36 bestdiast 70% hrt calciu · axial · 0.38mm/px · z∈[+1576,+1672]mm · 4 of 80 slices shown]
[im 16/80  vessel]
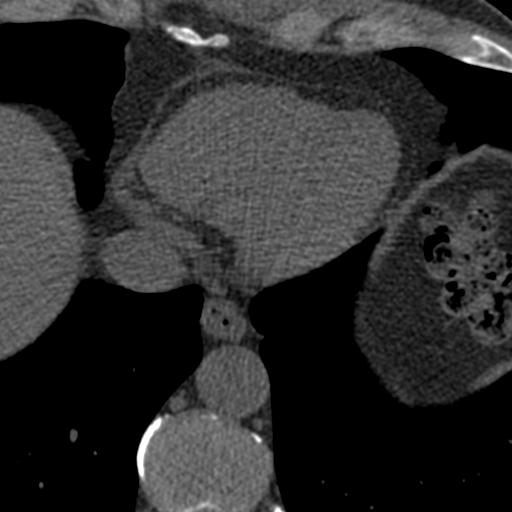
[im 32/80  vessel]
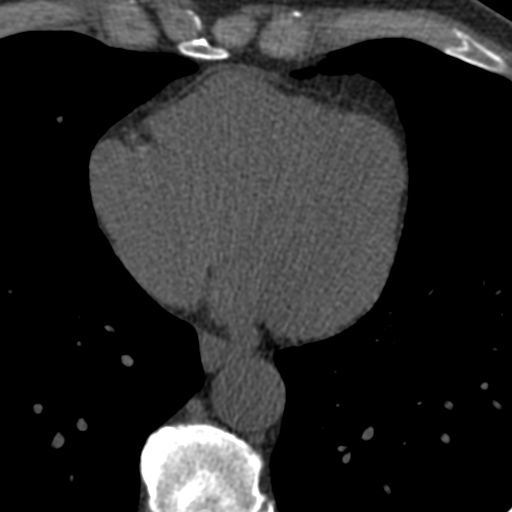
[im 48/80  vessel]
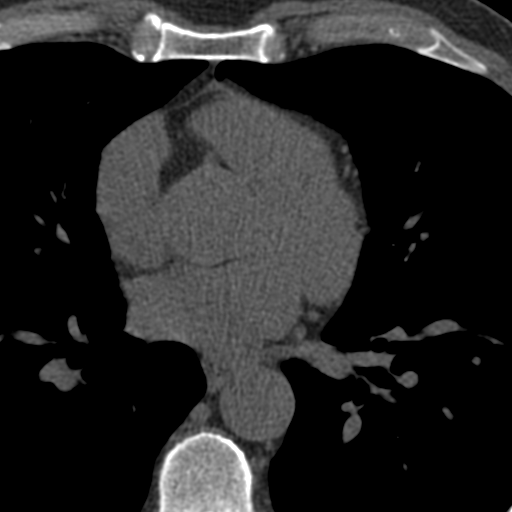
[im 64/80  vessel]
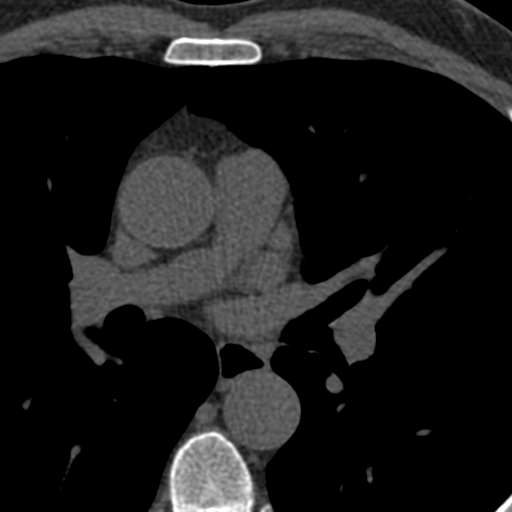

[Series 3: calcium scoring 2.00 br40 bestdiast 70% axial · axial · 0.60mm/px · z∈[+1572,+1676]mm · 5 of 80 slices shown, 7 images]
[im 14/80  vessel]
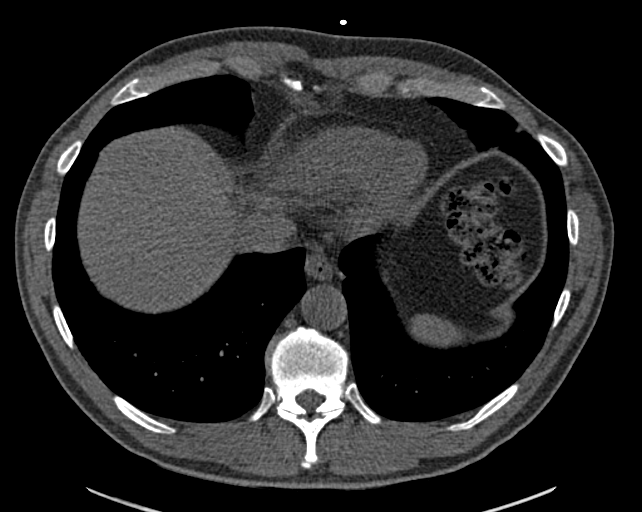
[im 14/80  lung]
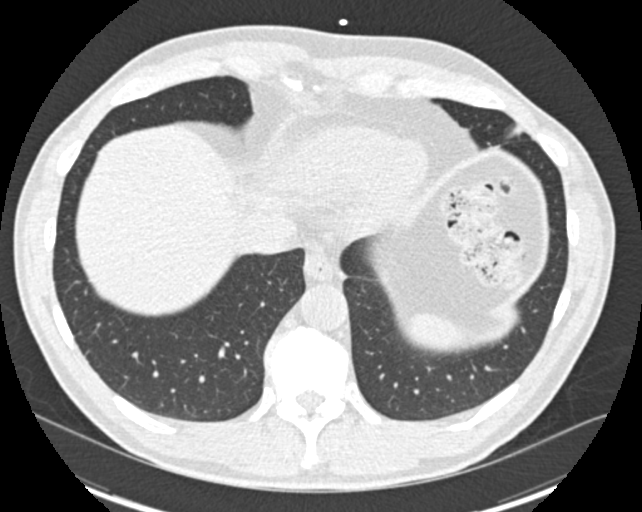
[im 27/80  vessel]
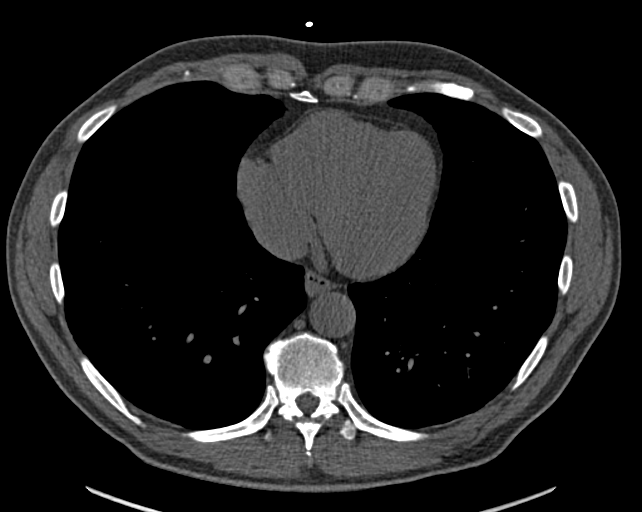
[im 40/80  vessel]
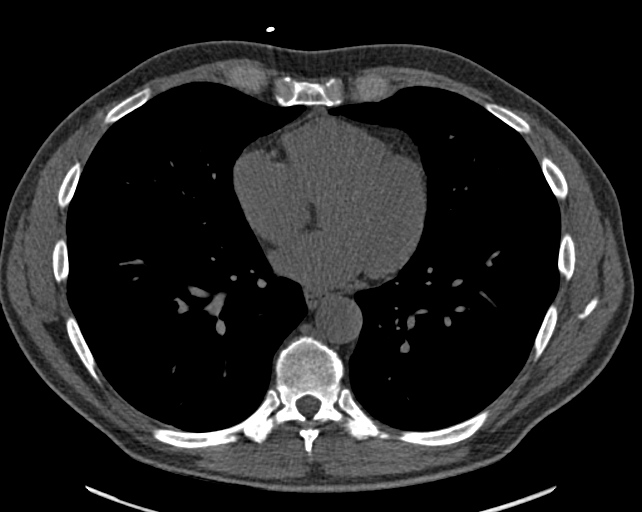
[im 53/80  vessel]
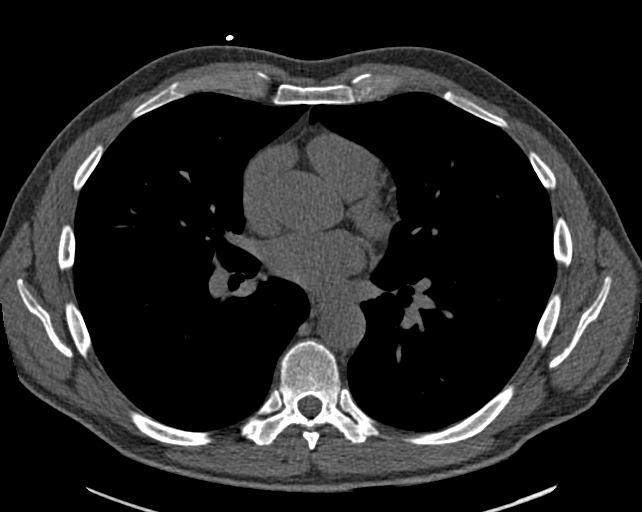
[im 66/80  vessel]
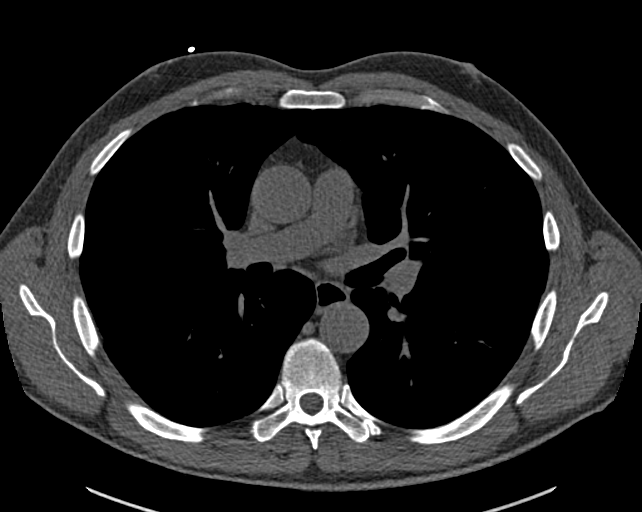
[im 66/80  lung]
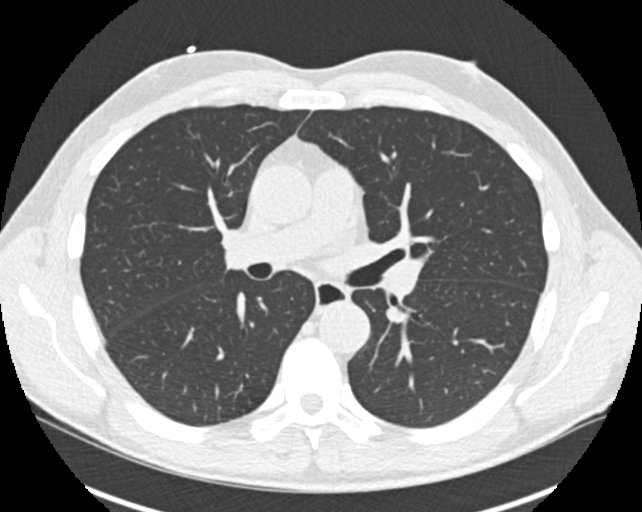

[Series 9: calcium scoring 2.00 br60 bestdiast 70% lungs · axial · 0.62mm/px · z∈[+1572,+1676]mm · 5 of 80 slices shown]
[im 14/80  vessel]
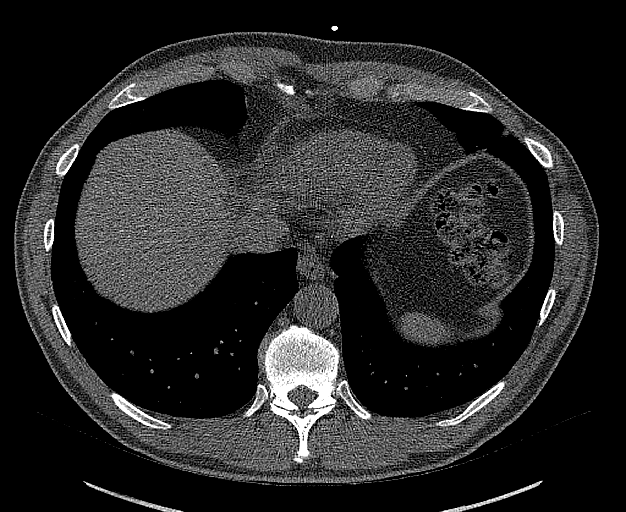
[im 27/80  vessel]
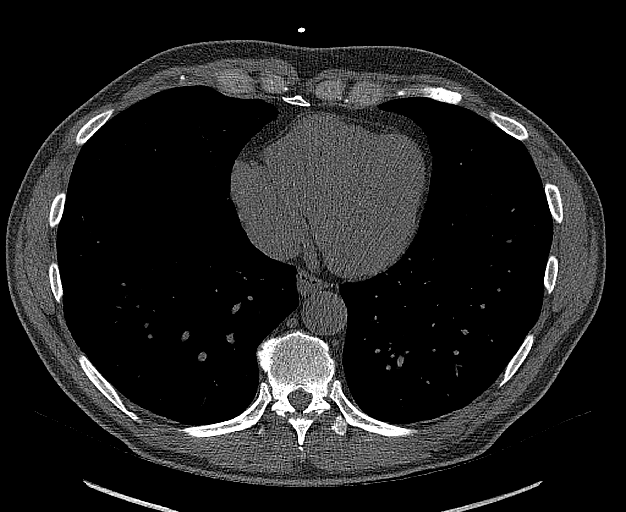
[im 40/80  vessel]
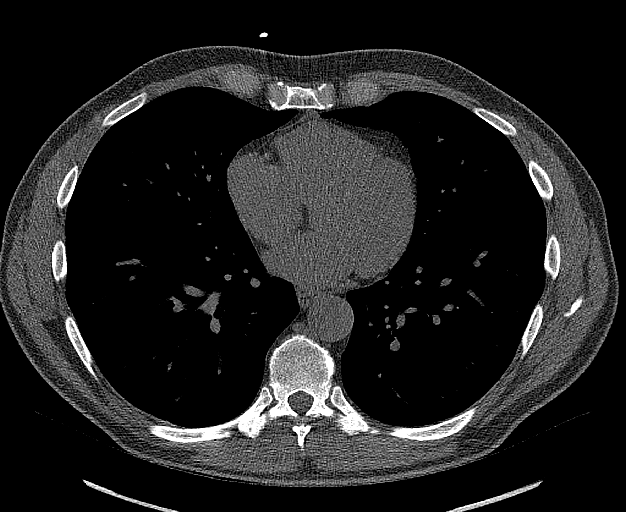
[im 53/80  vessel]
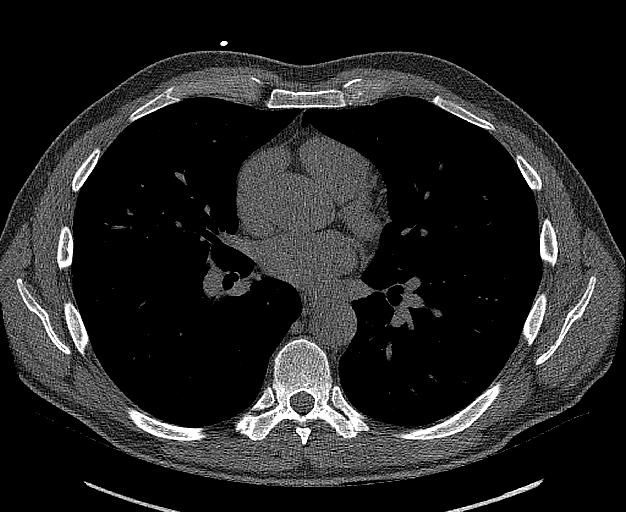
[im 66/80  vessel]
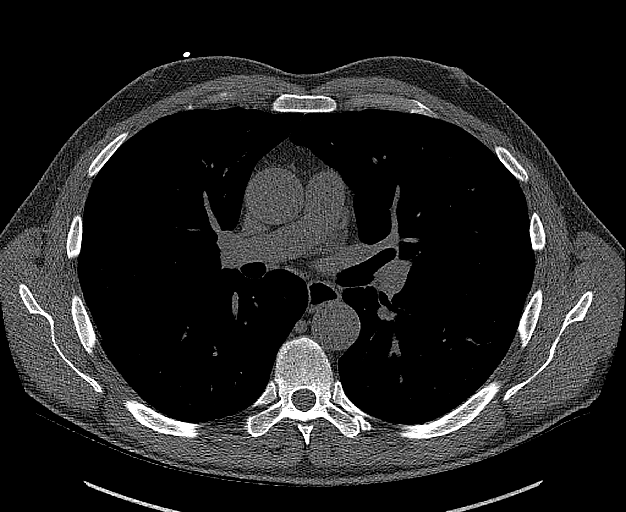

[14 of 20 positions shown; findings below may reference images not displayed]

FINDINGS: CORONARY CALCIUM SCORES:

Left Main: 0

LAD: 0

LCx: 16.9 isolated calcification within the proximal to mid left
circumflex on [DATE].

RCA: 0

Total Agatston Score:

[HOSPITAL] percentile: 52nd

AORTA MEASUREMENTS:

Ascending Aorta: 33 mm

Descending Aorta: 28 mm

OTHER FINDINGS:

Cardiovascular: Normal aortic caliber. Normal heart size, without
pericardial effusion.

Mediastinum/Nodes: No imaged thoracic adenopathy.

Lungs/Pleura: No pleural fluid. Clear imaged lungs.

Upper Abdomen: Normal imaged portions of the liver, spleen, stomach

Musculoskeletal: No acute osseous abnormality.
IMPRESSION: 1. Total Agatston score of 16.9, corresponding to 52nd percentile
for age, sex, and race based cohort.
2. No acute process in the imaged chest.

## 2020-11-11 DIAGNOSIS — R82998 Other abnormal findings in urine: Secondary | ICD-10-CM | POA: Diagnosis not present

## 2020-11-11 DIAGNOSIS — Z125 Encounter for screening for malignant neoplasm of prostate: Secondary | ICD-10-CM | POA: Diagnosis not present

## 2020-11-11 DIAGNOSIS — E785 Hyperlipidemia, unspecified: Secondary | ICD-10-CM | POA: Diagnosis not present

## 2020-11-11 DIAGNOSIS — R7301 Impaired fasting glucose: Secondary | ICD-10-CM | POA: Diagnosis not present

## 2020-11-17 DIAGNOSIS — Z1331 Encounter for screening for depression: Secondary | ICD-10-CM | POA: Diagnosis not present

## 2020-11-17 DIAGNOSIS — Z1212 Encounter for screening for malignant neoplasm of rectum: Secondary | ICD-10-CM | POA: Diagnosis not present

## 2020-11-17 DIAGNOSIS — E785 Hyperlipidemia, unspecified: Secondary | ICD-10-CM | POA: Diagnosis not present

## 2020-11-17 DIAGNOSIS — Z23 Encounter for immunization: Secondary | ICD-10-CM | POA: Diagnosis not present

## 2020-11-17 DIAGNOSIS — Z1339 Encounter for screening examination for other mental health and behavioral disorders: Secondary | ICD-10-CM | POA: Diagnosis not present

## 2020-11-17 DIAGNOSIS — Z0000 Encounter for general adult medical examination without abnormal findings: Secondary | ICD-10-CM | POA: Diagnosis not present

## 2021-09-04 ENCOUNTER — Encounter: Payer: Self-pay | Admitting: Internal Medicine

## 2021-09-21 ENCOUNTER — Encounter: Payer: No Typology Code available for payment source | Admitting: Internal Medicine

## 2021-10-23 ENCOUNTER — Other Ambulatory Visit: Payer: Self-pay

## 2021-10-23 ENCOUNTER — Ambulatory Visit (AMBULATORY_SURGERY_CENTER): Payer: Self-pay | Admitting: *Deleted

## 2021-10-23 VITALS — Ht 73.0 in | Wt 194.0 lb

## 2021-10-23 DIAGNOSIS — Z86010 Personal history of colonic polyps: Secondary | ICD-10-CM

## 2021-10-23 MED ORDER — NA SULFATE-K SULFATE-MG SULF 17.5-3.13-1.6 GM/177ML PO SOLN: 1.0000 | ORAL | 0 refills | Status: DC

## 2021-10-23 NOTE — Progress Notes (Signed)
Patient is here in-person for PV. Patient denies any allergies to eggs or soy. Patient denies any problems with anesthesia/sedation. Patient is not on any oxygen at home. Patient is not taking any diet/weight loss medications or blood thinners. Patient is aware of our care-partner policy and BMBOM-85 safety protocol.   Patient is COVID-19 vaccinated. Patient will use singlecare for cost of suprep.

## 2021-10-26 ENCOUNTER — Encounter: Payer: Self-pay | Admitting: Internal Medicine

## 2021-10-30 ENCOUNTER — Other Ambulatory Visit: Payer: Self-pay

## 2021-10-30 ENCOUNTER — Ambulatory Visit (AMBULATORY_SURGERY_CENTER): Payer: BC Managed Care – PPO | Admitting: Internal Medicine

## 2021-10-30 ENCOUNTER — Encounter: Payer: Self-pay | Admitting: Internal Medicine

## 2021-10-30 VITALS — BP 103/60 | HR 66 | Temp 98.4°F | Resp 15 | Ht 73.0 in | Wt 194.0 lb

## 2021-10-30 DIAGNOSIS — Z1211 Encounter for screening for malignant neoplasm of colon: Secondary | ICD-10-CM | POA: Diagnosis not present

## 2021-10-30 DIAGNOSIS — D122 Benign neoplasm of ascending colon: Secondary | ICD-10-CM

## 2021-10-30 DIAGNOSIS — Z86010 Personal history of colonic polyps: Secondary | ICD-10-CM | POA: Diagnosis not present

## 2021-10-30 MED ORDER — SODIUM CHLORIDE 0.9 % IV SOLN: 500.0000 mL | Freq: Once | INTRAVENOUS | Status: DC

## 2021-10-30 NOTE — Op Note (Signed)
[facility] ?Patient Name: Dan Mckenzie ?Procedure Date: 10/30/2021 12:46 PM ?MRN: [id number] ?Endoscopist: Docia Chuck. Henrene Pastor , MD ?Age: 61 ?Referring MD:  ?Date of Birth: December 18, 1960 ?Gender: Male ?Account #: [id number] ?Procedure:                Colonoscopy with cold snare polypectomy x 2 ?Indications:              High risk colon cancer surveillance: Personal  ?                          history of non-advanced adenoma. Index examination  ?                          February 2015 ?Medicines:                Monitored Anesthesia Care ?Procedure:                Pre-Anesthesia Assessment: ?                          - Prior to the procedure, a History and Physical  ?                          was performed, and patient medications and  ?                          allergies were reviewed. The patient's tolerance of  ?                          previous anesthesia was also reviewed. The risks  ?                          and benefits of the procedure and the sedation  ?                          options and risks were discussed with the patient.  ?                          All questions were answered, and informed consent  ?                          was obtained. Prior Anticoagulants: The patient has  ?                          taken no previous anticoagulant or antiplatelet  ?                          agents. ASA Grade Assessment: I - A normal, healthy  ?                          patient. After reviewing the risks and benefits,  ?                          the patient was deemed in satisfactory condition to  ?  undergo the procedure. ?                          After obtaining informed consent, the colonoscope  ?                          was passed under direct vision. Throughout the  ?                          procedure, the patient's blood pressure, pulse, and  ?                          oxygen saturations were monitored continuously. The  ?                          CF [id number] #[id number] was  introduced through the anus  ?                          and advanced to the the cecum, identified by  ?                          appendiceal orifice and ileocecal valve. The  ?                          ileocecal valve, appendiceal orifice, and rectum  ?                          were photographed. The quality of the bowel  ?                          preparation was excellent. The colonoscopy was  ?                          performed without difficulty. The patient tolerated  ?                          the procedure well. The bowel preparation used was  ?                          SUPREP via split dose instruction. ?Scope In: 1:32:23 PM ?Scope Out: 1:48:28 PM ?Scope Withdrawal Time: 0 hours 12 minutes 53 seconds  ?Total Procedure Duration: 0 hours 16 minutes 5 seconds  ?Findings:                 Two polyps were found in the ascending colon. The  ?                          polyps were 2 to 4 mm in size. These polyps were  ?                          removed with a cold snare. Resection and retrieval  ?                          were complete. ?  The exam was otherwise without abnormality on  ?                          direct and retroflexion views. ?Complications:            No immediate complications. Estimated blood loss:  ?                          None. ?Estimated Blood Loss:     Estimated blood loss: none. ?Impression:               - Two 2 to 4 mm polyps in the ascending colon,  ?                          removed with a cold snare. Resected and retrieved. ?                          - The examination was otherwise normal on direct  ?                          and retroflexion views. ?Recommendation:           - Repeat colonoscopy in 5 years if both polyps  ?                          adenomatous; 7 years of 1 polyp adenomatous; 10  ?                          years if neither polyp adenomatous for surveillance. ?                          - Patient has a contact number available for  ?                           emergencies. The signs and symptoms of potential  ?                          delayed complications were discussed with the  ?                          patient. Return to normal activities tomorrow.  ?                          Written discharge instructions were provided to the  ?                          patient. ?                          - Resume previous diet. ?                          - Continue present medications. ?                          - Await pathology results. ?Docia Chuck. Henrene Pastor, MD ?  10/30/2021 1:53:42 PM ?This report has been signed electronically. ?

## 2021-10-30 NOTE — Progress Notes (Signed)
HISTORY OF PRESENT ILLNESS: ? ?Dan Mckenzie is a 61 y.o. male with a history of tubular adenoma February 2015.  Presents for surveillance colonoscopy.  No active complaints ? ?REVIEW OF SYSTEMS: ? ?All non-GI ROS negative. ?Past Medical History:  ?Diagnosis Date  ? Hyperlipidemia   ? No pertinent past medical history   ? ? ?Past Surgical History:  ?Procedure Laterality Date  ? COLONOSCOPY  10/24/2013  ? Dr.Jesika Mckenzie  ? GUM SURGERY    ? INGUINAL HERNIA REPAIR  05/16/2012  ? Procedure: HERNIA REPAIR INGUINAL ADULT;  Surgeon: Dan Earls, MD;  Location: [facility];  Service: General;  Laterality: Left;  open left inguinal hernia repair  ? POLYPECTOMY    ? ? ?Social History ?Dan Mckenzie  reports that he has never smoked. He has never used smokeless tobacco. He reports current alcohol use of about 1.0 standard drink per week. He reports that he does not use drugs. ? ?family history includes Colon cancer in his maternal uncle. ? ?No Known Allergies ? ?  ? ?PHYSICAL EXAMINATION: ? ?Vital signs: BP 136/89   Pulse 84   Temp 98.4 ?F (36.9 ?C)   Ht 6\' 1"  (1.854 m)   Wt 194 lb (88 kg)   SpO2 96%   BMI 25.60 kg/m?  ?General: Well-developed, well-nourished, no acute distress ?HEENT: Sclerae are anicteric, conjunctiva pink. Oral mucosa intact ?Lungs: Clear ?Heart: Regular ?Abdomen: soft, nontender, nondistended, no obvious ascites, no peritoneal signs, normal bowel sounds. No organomegaly. ?Extremities: No edema ?Psychiatric: alert and oriented x3. Cooperative  ? ? ? ?ASSESSMENT: ? ?Personal history of adenomatous colon polyps.  Due for surveillance ? ? ?PLAN: ? ? ?Surveillance colonoscopy ? ? ? ?  ?

## 2021-10-30 NOTE — Progress Notes (Signed)
Sedate, gd SR, tolerated procedure well, VSS, report to RN 

## 2021-10-30 NOTE — Patient Instructions (Signed)
Handout given for polyps.  YOU HAD AN ENDOSCOPIC PROCEDURE TODAY AT THE [facility]:   Refer to the procedure report that was given to you for any specific questions about what was found during the examination.  If the procedure report does not answer your questions, please call your gastroenterologist to clarify.  If you requested that your care partner not be given the details of your procedure findings, then the procedure report has been included in a sealed envelope for you to review at your convenience later.  YOU SHOULD EXPECT: Some feelings of bloating in the abdomen. Passage of more gas than usual.  Walking can help get rid of the air that was put into your GI tract during the procedure and reduce the bloating. If you had a lower endoscopy (such as a colonoscopy or flexible sigmoidoscopy) you may notice spotting of blood in your stool or on the toilet paper. If you underwent a bowel prep for your procedure, you may not have a normal bowel movement for a few days.  Please Note:  You might notice some irritation and congestion in your nose or some drainage.  This is from the oxygen used during your procedure.  There is no need for concern and it should clear up in a day or so.  SYMPTOMS TO REPORT IMMEDIATELY:   Following lower endoscopy (colonoscopy or flexible sigmoidoscopy):  Excessive amounts of blood in the stool  Significant tenderness or worsening of abdominal pains  Swelling of the abdomen that is new, acute  Fever of 100F or higher  For urgent or emergent issues, a gastroenterologist can be reached at any hour by calling [phone]. Do not use MyChart messaging for urgent concerns.    DIET:  We do recommend a small meal at first, but then you may proceed to your regular diet.  Drink plenty of fluids but you should avoid alcoholic beverages for 24 hours.  ACTIVITY:  You should plan to take it easy for the rest of today and you should NOT DRIVE or use heavy  machinery until tomorrow (because of the sedation medicines used during the test).    FOLLOW UP: Our staff will call the number listed on your records 48-72 hours following your procedure to check on you and address any questions or concerns that you may have regarding the information given to you following your procedure. If we do not reach you, we will leave a message.  We will attempt to reach you two times.  During this call, we will ask if you have developed any symptoms of COVID 19. If you develop any symptoms (ie: fever, flu-like symptoms, shortness of breath, cough etc.) before then, please call [phone].  If you test positive for Covid 19 in the 2 weeks post procedure, please call and report this information to us.    If any biopsies were taken you will be contacted by phone or by letter within the next 1-3 weeks.  Please call us at [phone] if you have not heard about the biopsies in 3 weeks.    SIGNATURES/CONFIDENTIALITY: You and/or your care partner have signed paperwork which will be entered into your electronic medical record.  These signatures attest to the fact that that the information above on your After Visit Summary has been reviewed and is understood.  Full responsibility of the confidentiality of this discharge information lies with you and/or your care-partner. 

## 2021-10-30 NOTE — Progress Notes (Signed)
VS- Dan Mckenzie  Pt's states no medical or surgical changes since previsit or office visit.  

## 2021-10-30 NOTE — Progress Notes (Signed)
Called to room to assist during endoscopic procedure.  Patient ID and intended procedure confirmed with present staff. Received instructions for my participation in the procedure from the performing physician.  

## 2021-11-03 ENCOUNTER — Telehealth: Payer: Self-pay

## 2021-11-03 ENCOUNTER — Telehealth: Payer: Self-pay | Admitting: *Deleted

## 2021-11-03 NOTE — Telephone Encounter (Signed)
?  Follow up Call- ? ?Call back number 10/30/2021  ?Post procedure Call Back phone  # [phone]  ?Permission to leave phone message Yes  ?Some recent data might be hidden  ?  ? ?Patient questions: ? ?Do you have a fever, pain , or abdominal swelling? No. ?Pain Score  0 * ? ?Have you tolerated food without any problems? Yes.   ? ?Have you been able to return to your normal activities? Yes.   ? ?Do you have any questions about your discharge instructions: ?Diet   No. ?Medications  No. ?Follow up visit  No. ? ?Do you have questions or concerns about your Care? No. ? ?Actions: ?* If pain score is 4 or above: ?No action needed, pain <4. ? ? ?

## 2021-11-03 NOTE — Telephone Encounter (Signed)
Left message on follow up call. 

## 2021-11-04 ENCOUNTER — Encounter: Payer: Self-pay | Admitting: Internal Medicine

## 2022-05-07 DIAGNOSIS — S61210A Laceration without foreign body of right index finger without damage to nail, initial encounter: Secondary | ICD-10-CM | POA: Diagnosis not present

## 2022-05-07 DIAGNOSIS — W269XXA Contact with unspecified sharp object(s), initial encounter: Secondary | ICD-10-CM | POA: Diagnosis not present

## 2022-05-07 DIAGNOSIS — S6991XA Unspecified injury of right wrist, hand and finger(s), initial encounter: Secondary | ICD-10-CM | POA: Diagnosis not present

## 2022-05-07 DIAGNOSIS — X58XXXA Exposure to other specified factors, initial encounter: Secondary | ICD-10-CM | POA: Diagnosis not present

## 2022-05-07 DIAGNOSIS — S61200A Unspecified open wound of right index finger without damage to nail, initial encounter: Secondary | ICD-10-CM | POA: Diagnosis not present

## 2022-05-18 DIAGNOSIS — M795 Residual foreign body in soft tissue: Secondary | ICD-10-CM | POA: Diagnosis not present

## 2022-05-18 DIAGNOSIS — M79604 Pain in right leg: Secondary | ICD-10-CM | POA: Diagnosis not present

## 2022-07-05 DIAGNOSIS — R82998 Other abnormal findings in urine: Secondary | ICD-10-CM | POA: Diagnosis not present

## 2022-07-05 DIAGNOSIS — Z125 Encounter for screening for malignant neoplasm of prostate: Secondary | ICD-10-CM | POA: Diagnosis not present

## 2022-07-05 DIAGNOSIS — E785 Hyperlipidemia, unspecified: Secondary | ICD-10-CM | POA: Diagnosis not present

## 2022-07-06 DIAGNOSIS — R7301 Impaired fasting glucose: Secondary | ICD-10-CM | POA: Diagnosis not present

## 2022-07-06 DIAGNOSIS — F900 Attention-deficit hyperactivity disorder, predominantly inattentive type: Secondary | ICD-10-CM | POA: Diagnosis not present

## 2022-07-06 DIAGNOSIS — Z1331 Encounter for screening for depression: Secondary | ICD-10-CM | POA: Diagnosis not present

## 2022-07-06 DIAGNOSIS — Z0000 Encounter for general adult medical examination without abnormal findings: Secondary | ICD-10-CM | POA: Diagnosis not present

## 2022-07-06 DIAGNOSIS — Z1339 Encounter for screening examination for other mental health and behavioral disorders: Secondary | ICD-10-CM | POA: Diagnosis not present

## 2023-05-31 DIAGNOSIS — S61412A Laceration without foreign body of left hand, initial encounter: Secondary | ICD-10-CM | POA: Diagnosis not present

## 2023-07-26 DIAGNOSIS — Z125 Encounter for screening for malignant neoplasm of prostate: Secondary | ICD-10-CM | POA: Diagnosis not present

## 2023-07-26 DIAGNOSIS — E785 Hyperlipidemia, unspecified: Secondary | ICD-10-CM | POA: Diagnosis not present

## 2023-07-26 DIAGNOSIS — R7301 Impaired fasting glucose: Secondary | ICD-10-CM | POA: Diagnosis not present

## 2023-08-02 DIAGNOSIS — R7301 Impaired fasting glucose: Secondary | ICD-10-CM | POA: Diagnosis not present

## 2023-08-02 DIAGNOSIS — Z1339 Encounter for screening examination for other mental health and behavioral disorders: Secondary | ICD-10-CM | POA: Diagnosis not present

## 2023-08-02 DIAGNOSIS — Z1331 Encounter for screening for depression: Secondary | ICD-10-CM | POA: Diagnosis not present

## 2023-08-02 DIAGNOSIS — R82998 Other abnormal findings in urine: Secondary | ICD-10-CM | POA: Diagnosis not present

## 2023-08-02 DIAGNOSIS — Z0000 Encounter for general adult medical examination without abnormal findings: Secondary | ICD-10-CM | POA: Diagnosis not present

## 2023-09-02 DIAGNOSIS — H00015 Hordeolum externum left lower eyelid: Secondary | ICD-10-CM | POA: Diagnosis not present

## 2023-09-14 DIAGNOSIS — H40013 Open angle with borderline findings, low risk, bilateral: Secondary | ICD-10-CM | POA: Diagnosis not present

## 2023-10-13 DIAGNOSIS — H40013 Open angle with borderline findings, low risk, bilateral: Secondary | ICD-10-CM | POA: Diagnosis not present

## 2024-09-18 ENCOUNTER — Other Ambulatory Visit: Payer: Self-pay | Admitting: Internal Medicine

## 2024-09-18 DIAGNOSIS — E785 Hyperlipidemia, unspecified: Secondary | ICD-10-CM

## 2024-10-02 ENCOUNTER — Ambulatory Visit
Admission: RE | Admit: 2024-10-02 | Discharge: 2024-10-02 | Disposition: A | Source: Ambulatory Visit | Attending: Internal Medicine | Admitting: Internal Medicine

## 2024-10-02 DIAGNOSIS — E785 Hyperlipidemia, unspecified: Secondary | ICD-10-CM
